# Patient Record
Sex: Male | Born: 2000
Health system: Southern US, Community
[De-identification: ages and names within clinical notes are randomized; demographics above are authoritative.]

---
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
## 2020-02-08 ENCOUNTER — Emergency Department (HOSPITAL_BASED_OUTPATIENT_CLINIC_OR_DEPARTMENT_OTHER): Payer: No Typology Code available for payment source

## 2020-02-08 ENCOUNTER — Encounter (HOSPITAL_BASED_OUTPATIENT_CLINIC_OR_DEPARTMENT_OTHER): Payer: Self-pay

## 2020-02-08 ENCOUNTER — Emergency Department (HOSPITAL_BASED_OUTPATIENT_CLINIC_OR_DEPARTMENT_OTHER)
Admission: EM | Admit: 2020-02-08 | Discharge: 2020-02-08 | Disposition: A | Discharge status: Home / Self Care | Payer: No Typology Code available for payment source | Attending: Emergency Medicine | Admitting: Emergency Medicine

## 2020-02-08 ENCOUNTER — Other Ambulatory Visit: Payer: Self-pay

## 2020-02-08 DIAGNOSIS — Y92410 Unspecified street and highway as the place of occurrence of the external cause: Secondary | ICD-10-CM | POA: Insufficient documentation

## 2020-02-08 DIAGNOSIS — Y999 Unspecified external cause status: Secondary | ICD-10-CM | POA: Insufficient documentation

## 2020-02-08 DIAGNOSIS — M545 Low back pain, unspecified: Secondary | ICD-10-CM

## 2020-02-08 DIAGNOSIS — S161XXA Strain of muscle, fascia and tendon at neck level, initial encounter: Secondary | ICD-10-CM | POA: Diagnosis not present

## 2020-02-08 DIAGNOSIS — G44319 Acute post-traumatic headache, not intractable: Secondary | ICD-10-CM

## 2020-02-08 DIAGNOSIS — S199XXA Unspecified injury of neck, initial encounter: Secondary | ICD-10-CM | POA: Diagnosis present

## 2020-02-08 DIAGNOSIS — Y939 Activity, unspecified: Secondary | ICD-10-CM | POA: Insufficient documentation

## 2020-02-08 MED ORDER — NAPROXEN 500 MG PO TABS: 500.0000 mg | ORAL_TABLET | Freq: Two times a day (BID) | ORAL | 0 refills | Status: DC

## 2020-02-08 MED ORDER — METHOCARBAMOL 500 MG PO TABS: 500.0000 mg | ORAL_TABLET | Freq: Two times a day (BID) | ORAL | 0 refills | Status: DC

## 2020-02-08 MED FILL — NAPROXEN 500 MG TABLET: 500 | 15 days supply | Qty: 30 | Fill #0

## 2020-02-08 MED FILL — METHOCARBAMOL 500 MG TABS: 500 | 10 days supply | Qty: 20 | Fill #0
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
## 2020-02-08 NOTE — Discharge Instructions (Signed)

## 2020-02-14 ENCOUNTER — Emergency Department (HOSPITAL_BASED_OUTPATIENT_CLINIC_OR_DEPARTMENT_OTHER)
Admission: EM | Admit: 2020-02-14 | Discharge: 2020-02-14 | Disposition: A | Discharge status: Home / Self Care | Payer: No Typology Code available for payment source | Attending: Emergency Medicine | Admitting: Emergency Medicine

## 2020-02-14 ENCOUNTER — Other Ambulatory Visit: Payer: Self-pay

## 2020-02-14 ENCOUNTER — Encounter (HOSPITAL_BASED_OUTPATIENT_CLINIC_OR_DEPARTMENT_OTHER): Payer: Self-pay | Admitting: Emergency Medicine

## 2020-02-14 ENCOUNTER — Emergency Department (HOSPITAL_BASED_OUTPATIENT_CLINIC_OR_DEPARTMENT_OTHER): Payer: No Typology Code available for payment source

## 2020-02-14 DIAGNOSIS — M545 Low back pain, unspecified: Secondary | ICD-10-CM

## 2020-02-14 MED ORDER — BACLOFEN 10 MG PO TABS: 10.0000 mg | ORAL_TABLET | Freq: Three times a day (TID) | ORAL | 0 refills | Status: AC

## 2020-02-14 MED FILL — BACLOFEN 10 MG TABS: 10 | 10 days supply | Qty: 30 | Fill #0
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
## 2020-11-05 ENCOUNTER — Other Ambulatory Visit: Payer: Self-pay

## 2020-11-05 ENCOUNTER — Encounter (HOSPITAL_BASED_OUTPATIENT_CLINIC_OR_DEPARTMENT_OTHER): Payer: Self-pay

## 2020-11-05 ENCOUNTER — Emergency Department (HOSPITAL_BASED_OUTPATIENT_CLINIC_OR_DEPARTMENT_OTHER)
Admission: EM | Admit: 2020-11-05 | Discharge: 2020-11-05 | Disposition: A | Discharge status: Home / Self Care | Payer: Medicaid Other | Attending: Emergency Medicine | Admitting: Emergency Medicine

## 2020-11-05 DIAGNOSIS — L02214 Cutaneous abscess of groin: Secondary | ICD-10-CM | POA: Insufficient documentation

## 2020-11-05 DIAGNOSIS — L0291 Cutaneous abscess, unspecified: Secondary | ICD-10-CM

## 2020-11-05 MED ORDER — LIDOCAINE-EPINEPHRINE (PF) 2 %-1:200000 IJ SOLN
10.0000 mL | Freq: Once | INTRAMUSCULAR | Status: AC
  Administered 2020-11-05: 10 mL
  Filled 2020-11-05: qty 20

## 2020-11-05 MED ORDER — ACETAMINOPHEN 500 MG PO TABS
1000.0000 mg | ORAL_TABLET | Freq: Once | ORAL | Status: AC
  Administered 2020-11-05: 1000 mg via ORAL
  Filled 2020-11-05: qty 2

## 2020-11-05 NOTE — ED Notes (Signed)
Three days ago noted a small "bump" like area at the post scrotal sac, states it very painful now and has gotten bigger, his scrotum is swollen per his statement and very tender. Denies any issues with urination, states he has no fevers as well. Has taken no meds of any kind for relief of pain. Allergy status confirmed, NKDA. Positioned for comfort, pt placed in pt gown, pants removed, warm blanket provided

## 2020-11-05 NOTE — Discharge Instructions (Addendum)
Your inner right thigh abscess was incised and drained today.  Please drink plenty of water, please use warm compresses on this area, please take the antibiotics you are as prescribed at [facility].  Please use Tylenol and ibuprofen for pain.  Please have this area looked at in 2 days by medical professional. Eric Henry may also return earlier for any new or concerning symptoms.  Please use Tylenol or ibuprofen for pain.  You may use 600 mg ibuprofen every 6 hours or 1000 mg of Tylenol every 6 hours.  You may choose to alternate between the 2.  This would be most effective.  Not to exceed 4 g of Tylenol within 24 hours.  Not to exceed 3200 mg ibuprofen 24 hours.  I have given you the phone number for clinic but does not require health insurance you may follow-up with in future for future primary care needs.

## 2020-11-05 NOTE — ED Triage Notes (Signed)
Pt states he has had a "bump" on his groin/right testicle that has been increasing in size over the past 3 days. Went to [facility] today, was prescribed antibiotics.

## 2020-11-05 NOTE — ED Provider Notes (Signed)
[facility] EMERGENCY DEPARTMENT Provider Note   CSN: [id number] Arrival date & time: 11/05/20  1926     History Chief Complaint  Patient presents with  . Groin Swelling    Eric Henry is a 19 y.o. male.  HPI Patient is a 19 year old male with no pertinent past medical history presented today with right inguinal area of pain he states that is been ongoing for approximately 2 or 3 days states that today it became significantly more uncomfortable.  He would to [facility] earlier today and was assessed in the given the option of incision and drainage versus antibiotics.  Patient opted for antibiotic and warm compresses.  However he states he went home and the pain became much worse.  He denies any fevers, chills, palpitations, nausea or vomiting.  No other systemic symptoms.  He denies any associated symptoms.  No mitigating factors no aggravating factors he has taken nothing for his pain prior to arrival in the ER.     History reviewed. No pertinent past medical history.  There are no problems to display for this patient.   History reviewed. No pertinent surgical history.     History reviewed. No pertinent family history.  Social History   Tobacco Use  . Smoking status: Never Smoker  . Smokeless tobacco: Never Used  Vaping Use  . Vaping Use: Never used  Substance Use Topics  . Alcohol use: Never  . Drug use: Never    Home Medications Prior to Admission medications   Medication Sig Start Date End Date Taking? Authorizing Provider  baclofen (LIORESAL) 10 MG tablet Take 1 tablet (10 mg total) by mouth 3 (three) times daily. 02/14/20   Arlyn Dunning, PA-C    Allergies    Patient has no known allergies.  Review of Systems   Review of Systems  Constitutional: Negative for chills and fever.  HENT: Negative for congestion.   Respiratory: Negative for shortness of breath.   Cardiovascular: Negative for chest pain.  Gastrointestinal: Negative  for abdominal pain.  Genitourinary:       Inner thigh pain and swelling  Denies testicular pain, scrotal pain, penile pain or swelling.  No discharge.  Musculoskeletal: Negative for neck pain.    Physical Exam Updated Vital Signs BP (!) 145/76 (BP Location: Right Arm)   Pulse 90   Temp 99.1 F (37.3 C) (Oral)   Resp 14   Ht 5\' 10"  (1.778 m)   Wt 70.3 kg   SpO2 97%   BMI 22.24 kg/m   Physical Exam Vitals and nursing note reviewed.  Constitutional:      General: He is not in acute distress.    Appearance: Normal appearance. He is not ill-appearing.  HENT:     Head: Normocephalic and atraumatic.  Eyes:     General: No scleral icterus.       Right eye: No discharge.        Left eye: No discharge.     Conjunctiva/sclera: Conjunctivae normal.  Pulmonary:     Effort: Pulmonary effort is normal.     Breath sounds: No stridor.  Genitourinary:    Comments: Testes are normal there is no tenderness to palpation, normal lie.  Normal cremasteric reflex.  Penis without lesions or abnormalities no discharge or tenderness to palpation. Skin:    Comments: Approximately 3 x 3 cm in diameter fluctuant area of tenderness to palpation with no significant surrounding erythema.  No warmth.  This area is located in the  inguinal crease of the right inner thigh.  Neurological:     Mental Status: He is alert and oriented to person, place, and time. Mental status is at baseline.     ED Results / Procedures / Treatments   Labs (all labs ordered are listed, but only abnormal results are displayed) Labs Reviewed - No data to display  EKG None  Radiology No results found.  Procedures .Marland KitchenIncision and Drainage  Date/Time: 11/05/2020 9:32 PM Performed by: Gailen Shelter, PA Authorized by: Gailen Shelter, PA   Consent:    Consent obtained:  Verbal   Consent given by:  Patient   Risks discussed:  Bleeding, incomplete drainage, pain and damage to other organs   Alternatives  discussed:  No treatment Universal protocol:    Procedure explained and questions answered to patient or proxy's satisfaction: yes     Relevant documents present and verified: yes     Test results available and properly labeled: yes     Imaging studies available: yes     Required blood products, implants, devices, and special equipment available: yes     Site/side marked: yes     Immediately prior to procedure a time out was called: yes     Patient identity confirmed:  Verbally with patient Location:    Type:  Abscess   Size:  3x2 cm   Location: right inguinal crease. Pre-procedure details:    Skin preparation:  Betadine Anesthesia (see MAR for exact dosages):    Anesthesia method:  Local infiltration   Local anesthetic:  Lidocaine 2% WITH epi Procedure type:    Complexity:  Complex Procedure details:    Incision types:  Single straight   Incision depth:  Subcutaneous   Scalpel blade:  11   Wound management:  Probed and deloculated, irrigated with saline and extensive cleaning   Drainage:  Purulent   Drainage amount:  Copious   Wound treatment:  Wound left open Post-procedure details:    Patient tolerance of procedure:  Tolerated well, no immediate complications Comments:     Patient tolerated procedure well.  Significant deloculation was required as patient has multiple septations and abscess which was identified on ultrasound.  Copious purulent fluid was expressed after deloculation. Ultrasound ED Soft Tissue  Date/Time: 11/05/2020 9:34 PM Performed by: Gailen Shelter, PA Authorized by: Gailen Shelter, PA   Procedure details:    Indications: localization of abscess     Transverse view:  Visualized   Longitudinal view:  Visualized   Images: not archived     Limitations:  Positioning Location:    Location comment:  Right inguinal crease   Side:  Right Findings:     abscess present    no cellulitis present   (including critical care time)  Medications Ordered  in ED Medications  lidocaine-EPINEPHrine (XYLOCAINE W/EPI) 2 %-1:200000 (PF) injection 10 mL (10 mLs Infiltration Given by Other 11/05/20 2020)  acetaminophen (TYLENOL) tablet 1,000 mg (1,000 mg Oral Given 11/05/20 2021)    ED Course  I have reviewed the triage vital signs and the nursing notes.  Pertinent labs & imaging results that were available during my care of the patient were reviewed by me and considered in my medical decision making (see chart for details).    MDM Rules/Calculators/A&P                          Patient is 19 year old male with right inguinal abscess. Been present  for 2 to 3 days but today became significantly more painful.  Was seen and [facility] and discharged with antibiotics has taken no meds.   Successful incision and drainage was done with significant deloculation with multiple-like lesions and septations broken up with cotton tip applicator.  Patient is already been prescribed Bactrim at [facility]  Given wound care precautions and instructed to follow-up with his primary care doctor or back in ER in 2 days for wound check.  He is understanding of plan.  Tylenol and ibuprofen for pain.  He feels significantly better after incision and drainage  Final Clinical Impression(s) / ED Diagnoses Final diagnoses:  Abscess    Rx / DC Orders ED Discharge Orders    None       Gailen Shelter, [place] 11/05/20 2137    Sabas Sous, MD 11/05/20 2234
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
## 2023-10-11 ENCOUNTER — Encounter (HOSPITAL_BASED_OUTPATIENT_CLINIC_OR_DEPARTMENT_OTHER): Payer: Self-pay | Admitting: Emergency Medicine

## 2023-10-11 ENCOUNTER — Emergency Department (HOSPITAL_BASED_OUTPATIENT_CLINIC_OR_DEPARTMENT_OTHER)
Admission: EM | Admit: 2023-10-11 | Discharge: 2023-10-11 | Disposition: A | Discharge status: Home / Self Care | Payer: Medicaid Other | Attending: Emergency Medicine | Admitting: Emergency Medicine

## 2023-10-11 ENCOUNTER — Other Ambulatory Visit: Payer: Self-pay

## 2023-10-11 DIAGNOSIS — K602 Anal fissure, unspecified: Secondary | ICD-10-CM | POA: Diagnosis not present

## 2023-10-11 DIAGNOSIS — K6289 Other specified diseases of anus and rectum: Secondary | ICD-10-CM

## 2023-10-11 MED ORDER — DOCUSATE SODIUM 100 MG PO CAPS: 100.0000 mg | ORAL_CAPSULE | Freq: Two times a day (BID) | ORAL | 0 refills | Status: AC

## 2023-10-11 MED ORDER — HYDROCODONE-ACETAMINOPHEN 5-325 MG PO TABS: 1.0000 | ORAL_TABLET | Freq: Four times a day (QID) | ORAL | 0 refills | Status: AC | PRN

## 2023-10-11 NOTE — Discharge Instructions (Addendum)
Take the stool softener as directed.  Take pain medicine as needed.  Make an appointment to follow-up with general surgery.  Clinically this seems to be consistent with anal fissure.  Return for any new or worse symptoms.

## 2023-10-11 NOTE — ED Triage Notes (Signed)
Pt c/o constipation since Thurs or Fri; c/o rectal pain

## 2023-10-11 NOTE — ED Provider Notes (Signed)
[place] EMERGENCY DEPARTMENT AT [facility] Provider Note   CSN: [id number] Arrival date & time: 10/11/23  1243     History  Chief Complaint  Patient presents with   Constipation    Eric Henry is a 22 y.o. male.  Patient with a complaint of anal pain with a little bit of bleeding.  Since Friday.  Patient denies any real abdominal pain.  Is very painful to have a bowel movement.  Seen some blood with wiping.  No prior history of any similar pain.  Past medical history past surgical history noncontributory.  Patient's temp on arrival 98.2 heart rate 86 respirations 16 blood pressure 164/108.  Oxygen saturation 99%.       Home Medications Prior to Admission medications   Medication Sig Start Date End Date Taking? Authorizing Provider  docusate sodium (COLACE) 100 MG capsule Take 1 capsule (100 mg total) by mouth every 12 (twelve) hours. 10/11/23  Yes Vanetta Mulders, MD  HYDROcodone-acetaminophen (NORCO/VICODIN) 5-325 MG tablet Take 1 tablet by mouth every 6 (six) hours as needed for moderate pain (pain score 4-6). 10/11/23  Yes Vanetta Mulders, MD  baclofen (LIORESAL) 10 MG tablet Take 1 tablet (10 mg total) by mouth 3 (three) times daily. 02/14/20   Arlyn Dunning, PA-C      Allergies    Patient has no known allergies.    Review of Systems   Review of Systems  Constitutional:  Negative for chills and fever.  HENT:  Negative for ear pain and sore throat.   Eyes:  Negative for pain and visual disturbance.  Respiratory:  Negative for cough and shortness of breath.   Cardiovascular:  Negative for chest pain and palpitations.  Gastrointestinal:  Positive for anal bleeding. Negative for abdominal pain and vomiting.  Genitourinary:  Negative for dysuria and hematuria.  Musculoskeletal:  Negative for arthralgias and back pain.  Skin:  Negative for color change and rash.  Neurological:  Negative for seizures and syncope.  All other systems reviewed and are  negative.   Physical Exam Updated Vital Signs BP (!) 164/108 (BP Location: Left Arm)   Pulse 86   Temp 98.2 F (36.8 C) (Oral)   Resp 16   Ht 1.778 m (5\' 10" )   Wt 70.3 kg   SpO2 99%   BMI 22.24 kg/m  Physical Exam Vitals and nursing note reviewed.  Constitutional:      General: He is not in acute distress.    Appearance: Normal appearance. He is well-developed.  HENT:     Head: Normocephalic and atraumatic.  Eyes:     Extraocular Movements: Extraocular movements intact.     Conjunctiva/sclera: Conjunctivae normal.     Pupils: Pupils are equal, round, and reactive to light.  Cardiovascular:     Rate and Rhythm: Normal rate and regular rhythm.     Heart sounds: No murmur heard. Pulmonary:     Effort: Pulmonary effort is normal. No respiratory distress.     Breath sounds: Normal breath sounds.  Abdominal:     General: There is no distension.     Palpations: Abdomen is soft.     Tenderness: There is no abdominal tenderness. There is no guarding.  Genitourinary:    Comments: Perianal area without any induration or fluctuance no tenderness.  No prolapsed internal hemorrhoids or thrombosed external hemorrhoids.  No skin tags no external hemorrhoids.  A lot of discomfort right at the anal verge.  Appears to be an anterior anal fissure.  Proximal rectal exam with a lot of discomfort in the anal canal.  No mass.  Stool is soft and light brown in color. Musculoskeletal:        General: No swelling.     Cervical back: Normal range of motion and neck supple.  Skin:    General: Skin is warm and dry.     Capillary Refill: Capillary refill takes less than 2 seconds.  Neurological:     General: No focal deficit present.     Mental Status: He is alert and oriented to person, place, and time.  Psychiatric:        Mood and Affect: Mood normal.     ED Results / Procedures / Treatments   Labs (all labs ordered are listed, but only abnormal results are displayed) Labs Reviewed - No  data to display  EKG None  Radiology No results found.  Procedures Procedures    Medications Ordered in ED Medications - No data to display  ED Course/ Medical Decision Making/ A&P                                 Medical Decision Making Risk OTC drugs. Prescription drug management.   No evidence of any thrombosed external hemorrhoids.  No evidence of any perianal abscess on exam.  Seems to be consistent with an anterior anal fissure.  Will treat with stool softeners pain medicine follow-up with general surgery.  Patient will return for any new or worse symptoms.  Patient drove himself here.  So will not provide any pain medication here.  Clinically patient most likely anterior anal fissure.  But precautions provided just in case there is a perirectal abscess.  But clinically this is unlikely.   Final Clinical Impression(s) / ED Diagnoses Final diagnoses:  Anal pain  Anal fissure    Rx / DC Orders ED Discharge Orders          Ordered    docusate sodium (COLACE) 100 MG capsule  Every 12 hours        10/11/23 1418    HYDROcodone-acetaminophen (NORCO/VICODIN) 5-325 MG tablet  Every 6 hours PRN        10/11/23 1418              Vanetta Mulders, MD 10/11/23 1423
